# Patient Record
Sex: Male | Born: 2000 | Race: Black or African American | Hispanic: No | Marital: Single | Smoking: Never smoker
Health system: Southern US, Community
[De-identification: ages and names within clinical notes are randomized; demographics above are authoritative.]

## PROBLEM LIST (undated history)

## (undated) DIAGNOSIS — R278 Other lack of coordination: Secondary | ICD-10-CM

## (undated) DIAGNOSIS — F909 Attention-deficit hyperactivity disorder, unspecified type: Secondary | ICD-10-CM

## (undated) DIAGNOSIS — R488 Other symbolic dysfunctions: Secondary | ICD-10-CM

## (undated) HISTORY — DX: Attention-deficit hyperactivity disorder, unspecified type: F90.9

## (undated) HISTORY — DX: Other lack of coordination: R27.8

## (undated) HISTORY — DX: Other symbolic dysfunctions: R48.8

---
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
## 2021-01-03 ENCOUNTER — Emergency Department: Payer: 59

## 2021-01-03 ENCOUNTER — Other Ambulatory Visit: Payer: Self-pay

## 2021-01-03 DIAGNOSIS — F419 Anxiety disorder, unspecified: Secondary | ICD-10-CM | POA: Insufficient documentation

## 2021-01-03 DIAGNOSIS — Z79899 Other long term (current) drug therapy: Secondary | ICD-10-CM | POA: Insufficient documentation

## 2021-01-03 DIAGNOSIS — R079 Chest pain, unspecified: Secondary | ICD-10-CM | POA: Insufficient documentation

## 2021-01-03 LAB — URINE DRUG SCREEN, QUALITATIVE (ARMC ONLY)
Amphetamines, Ur Screen: NOT DETECTED
Barbiturates, Ur Screen: NOT DETECTED
Benzodiazepine, Ur Scrn: NOT DETECTED
Cannabinoid 50 Ng, Ur ~~LOC~~: NOT DETECTED
Cocaine Metabolite,Ur ~~LOC~~: NOT DETECTED
MDMA (Ecstasy)Ur Screen: NOT DETECTED
Methadone Scn, Ur: NOT DETECTED
Opiate, Ur Screen: NOT DETECTED
Phencyclidine (PCP) Ur S: NOT DETECTED
Tricyclic, Ur Screen: NOT DETECTED

## 2021-01-03 LAB — COMPREHENSIVE METABOLIC PANEL
ALT: 15 U/L (ref 0–44)
AST: 30 U/L (ref 15–41)
Albumin: 4.7 g/dL (ref 3.5–5.0)
Alkaline Phosphatase: 76 U/L (ref 38–126)
Anion gap: 9 (ref 5–15)
BUN: 7 mg/dL (ref 6–20)
CO2: 24 mmol/L (ref 22–32)
Calcium: 9.7 mg/dL (ref 8.9–10.3)
Chloride: 106 mmol/L (ref 98–111)
Creatinine, Ser: 0.83 mg/dL (ref 0.61–1.24)
GFR, Estimated: >60 mL/min (ref >60)
Glucose, Bld: 87 mg/dL (ref 70–99)
Potassium: 3.6 mmol/L (ref 3.5–5.1)
Sodium: 139 mmol/L (ref 135–145)
Total Bilirubin: 0.7 mg/dL (ref 0.3–1.2)
Total Protein: 7.5 g/dL (ref 6.5–8.1)

## 2021-01-03 LAB — TROPONIN I (HIGH SENSITIVITY)
Troponin I (High Sensitivity): <2 ng/L (ref <18)
Troponin I (High Sensitivity): <2 ng/L (ref <18)

## 2021-01-03 LAB — CBC
HCT: 42.5 % (ref 39.0–52.0)
Hemoglobin: 14.9 g/dL (ref 13.0–17.0)
MCH: 30.4 pg (ref 26.0–34.0)
MCHC: 35.1 g/dL (ref 30.0–36.0)
MCV: 86.7 fL (ref 80.0–100.0)
Platelets: 247 10*3/uL (ref 150–400)
RBC: 4.9 MIL/uL (ref 4.22–5.81)
RDW: 11.9 % (ref 11.5–15.5)
WBC: 5.1 10*3/uL (ref 4.0–10.5)
nRBC: 0 % (ref 0.0–0.2)

## 2021-01-03 LAB — ETHANOL: Alcohol, Ethyl (B): <10 mg/dL (ref <10)

## 2021-01-03 NOTE — ED Triage Notes (Signed)
Pt in with co chest pain x 30 min pt has hx of the same and was dx with panic attacks. Was prescribed anxiety meds and did take it today. Pt denies any SI or HI.

## 2021-01-03 NOTE — ED Notes (Signed)
Called for repeat labs from lobby without answer.

## 2021-01-04 ENCOUNTER — Emergency Department
Admission: EM | Admit: 2021-01-04 | Discharge: 2021-01-04 | Disposition: A | Discharge status: Home / Self Care | Payer: 59 | Attending: Emergency Medicine | Admitting: Emergency Medicine

## 2021-01-04 DIAGNOSIS — R079 Chest pain, unspecified: Secondary | ICD-10-CM

## 2021-01-04 DIAGNOSIS — F419 Anxiety disorder, unspecified: Secondary | ICD-10-CM

## 2021-01-04 NOTE — Discharge Instructions (Addendum)
Please make an appointment with [facility] to see a therapist.  Return to the ER for worsening symptoms, feelings of hurting yourself or others, or other concerns.

## 2021-01-04 NOTE — ED Provider Notes (Signed)
[facility] Emergency Department Provider Note   ____________________________________________   Event Date/Time   First MD Initiated Contact with Patient 01/04/21 [phone]     (approximate)  I have reviewed the triage vital signs and the nursing notes.   HISTORY  Chief Complaint Chest Pain and Anxiety    HPI Jason Cooke is a 20 y.o. male who presents to the ED from work with a chief complaint of chest pain and anxiety.  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  Went outside to get some fresh air and felt anxious.  He was recently noticed with panic attacks for similar symptoms; started on anxiolytic several days ago.  Neither he nor the mother can recall which medicine but he is to take it daily and he was only prescribed 15 pills.  Mother states these anxiety/panic attack episodes happened after he smoked synthetic marijuana several weeks ago.  Denies recent fever, cough, shortness of breath, abdominal pain, nausea, vomiting or dizziness.  Voices no complaints of chest pain at this time.  Denies recent travel, trauma or hormone use.  Denies SI/HI/AH/VH.     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  Prior to Admission medications   Medication Sig Start Date End Date Taking? Authorizing Provider  VYVANSE 60 MG capsule Take 1 capsule (60 mg total) by mouth every morning. 11/10/18   Wonda Cheng A, NP    Allergies Other  Family History  Problem Relation Age of Onset   Diabetes Mother    Cancer Maternal Aunt        breast   Dementia Maternal  Grandfather    Hypertension Paternal Uncle    Heart disease Paternal Uncle    Cancer Paternal Grandmother    Cancer Paternal Grandfather    Seizures Paternal Aunt    Anuerysm Paternal Aunt    Multiple sclerosis Paternal Aunt    Autism Cousin     Social History Social History   Tobacco Use   Smoking status: Never   Smokeless tobacco: Never  Substance Use Topics   Alcohol use: No    Alcohol/week: 0.0 standard drinks   Drug use: No    Review of Systems  Constitutional: No fever/chills Eyes: No visual changes. ENT: No sore throat. Cardiovascular: Positive for chest pain. Respiratory: Denies shortness of breath. Gastrointestinal: No abdominal pain.  No nausea, no vomiting.  No diarrhea.  No constipation. Genitourinary: Negative for dysuria. Musculoskeletal: Negative for back pain. Skin: Negative for rash. Neurological: Negative for headaches, focal weakness or numbness. Psychiatric: Positive for anxiety.  ____________________________________________   PHYSICAL EXAM:  VITAL SIGNS: ED Triage Vitals  Enc Vitals Group     BP 01/03/21 2015 (!) 148/88     Pulse Rate 01/03/21 2015 85     Resp 01/03/21 2015 20     Temp 01/03/21 2015 98.7 F (37.1 C)     Temp Source 01/03/21 2015 Oral     SpO2 01/03/21 2015 100 %     Weight 01/03/21 2013 138 lb (62.6 kg)     Height 01/03/21 2013 5\' 7"  (1.702 m)  Head Circumference --      Peak Flow --      Pain Score 01/03/21 2013 8     Pain Loc --      Pain Edu? --      Excl. in GC? --     Constitutional: Alert and oriented. Well appearing and in no acute distress. Eyes: Conjunctivae are normal. PERRL. EOMI. Head: Atraumatic. Nose: No congestion/rhinnorhea. Mouth/Throat: Mucous membranes are moist.   Neck: No stridor.  No thyromegaly. Cardiovascular: Normal rate, regular rhythm. Grossly normal heart sounds.  Good peripheral circulation. Respiratory: Normal respiratory effort.  No retractions. Lungs CTAB. Gastrointestinal:  Soft and nontender. No distention. No abdominal bruits. No CVA tenderness. Musculoskeletal: No lower extremity tenderness nor edema.  No joint effusions. Neurologic:  Normal speech and language. No gross focal neurologic deficits are appreciated. No gait instability. Skin:  Skin is warm, dry and intact. No rash noted. Psychiatric: Mood and affect are normal. Speech and behavior are normal.  ____________________________________________   LABS (all labs ordered are listed, but only abnormal results are displayed)  Labs Reviewed  CBC  COMPREHENSIVE METABOLIC PANEL  URINE DRUG SCREEN, QUALITATIVE ([facility] ONLY)  ETHANOL  TROPONIN I (HIGH SENSITIVITY)  TROPONIN I (HIGH SENSITIVITY)   ____________________________________________  EKG  ED ECG REPORT I, Timmya Blazier J, the attending physician, personally viewed and interpreted this ECG.   Date: 01/04/2021  EKG Time: 2043  Rate: 71  Rhythm: normal sinus rhythm  Axis: Normal  Intervals:none  ST&T Change: Nonspecific  ____________________________________________  RADIOLOGY I, Melyna Huron J, personally viewed and evaluated these images (plain radiographs) as part of my medical decision making, as well as reviewing the written report by the radiologist.  ED MD interpretation: No acute cardiopulmonary process  Official radiology report(s): DG Chest 2 View  Result Date: 01/03/2021 CLINICAL DATA:  Chest pain over the last 30 minutes EXAM: CHEST - 2 VIEW COMPARISON:  04/18/2018 FINDINGS: The heart size and mediastinal contours are within normal limits. Both lungs are clear. The visualized skeletal structures are unremarkable. IMPRESSION: No active cardiopulmonary disease is radiographically apparent. Electronically Signed   By: Gaylyn Rong M.D.   On: 01/03/2021 21:47    ____________________________________________   PROCEDURES  Procedure(s) performed (including Critical  Care):  Procedures   ____________________________________________   INITIAL IMPRESSION / ASSESSMENT AND PLAN / ED COURSE  As part of my medical decision making, I reviewed the following data within the electronic MEDICAL RECORD NUMBER History obtained from family, Nursing notes reviewed and incorporated, Labs reviewed, EKG interpreted, Old chart reviewed, and Notes from prior ED visits     20 year old male presenting with chest pain, anxiety. Differential diagnosis includes, but is not limited to, ACS, aortic dissection, pulmonary embolism, cardiac tamponade, pneumothorax, pneumonia, pericarditis, myocarditis, GI-related causes including esophagitis/gastritis, and musculoskeletal chest wall pain.     Cardiac work-up unremarkable.  I am unable to find what anxiolytic urgent care place patient on.  Would not want to prescribe something that might double dose or overdose him.  Encourage patient to follow-up with [facility].  Strict return precautions given.  Patient verbalizes understanding agrees with plan of care.       ____________________________________________   FINAL CLINICAL IMPRESSION(S) / ED DIAGNOSES  Final diagnoses:  Anxiety  Chest pain, unspecified type     ED Discharge Orders     None        Note:  This document was prepared using Dragon voice recognition software and may include unintentional dictation errors.  Irean Hong, MD 01/04/21 [phone]

## 2023-09-01 ENCOUNTER — Other Ambulatory Visit: Payer: Self-pay

## 2023-09-01 ENCOUNTER — Emergency Department
Admission: EM | Admit: 2023-09-01 | Discharge: 2023-09-02 | Disposition: A | Discharge status: Home / Self Care | Attending: Emergency Medicine | Admitting: Emergency Medicine

## 2023-09-01 ENCOUNTER — Emergency Department

## 2023-09-01 DIAGNOSIS — S2020XA Contusion of thorax, unspecified, initial encounter: Secondary | ICD-10-CM | POA: Insufficient documentation

## 2023-09-01 DIAGNOSIS — S20229A Contusion of unspecified back wall of thorax, initial encounter: Secondary | ICD-10-CM

## 2023-09-01 DIAGNOSIS — S301XXA Contusion of abdominal wall, initial encounter: Secondary | ICD-10-CM | POA: Insufficient documentation

## 2023-09-01 DIAGNOSIS — Y92410 Unspecified street and highway as the place of occurrence of the external cause: Secondary | ICD-10-CM | POA: Diagnosis not present

## 2023-09-01 DIAGNOSIS — T796XXA Traumatic ischemia of muscle, initial encounter: Secondary | ICD-10-CM | POA: Insufficient documentation

## 2023-09-01 DIAGNOSIS — T1490XA Injury, unspecified, initial encounter: Secondary | ICD-10-CM

## 2023-09-01 MED ORDER — SODIUM CHLORIDE 0.9 % IV BOLUS
500.0000 mL | Freq: Once | INTRAVENOUS | Status: AC
  Administered 2023-09-02: 500 mL via INTRAVENOUS

## 2023-09-01 MED ORDER — ONDANSETRON HCL 4 MG/2ML IJ SOLN
4.0000 mg | Freq: Once | INTRAMUSCULAR | Status: AC
  Administered 2023-09-01: 4 mg via INTRAVENOUS
  Filled 2023-09-01: qty 2

## 2023-09-01 MED ORDER — FENTANYL CITRATE PF 50 MCG/ML IJ SOSY
50.0000 ug | PREFILLED_SYRINGE | Freq: Once | INTRAMUSCULAR | Status: AC
  Administered 2023-09-01: 50 ug via INTRAVENOUS
  Filled 2023-09-01: qty 1
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
## 2023-09-01 NOTE — ED Notes (Signed)
 Sent blue, red, light green, and lavender tubes to lab
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
## 2023-09-02 ENCOUNTER — Emergency Department

## 2023-09-02 LAB — COMPREHENSIVE METABOLIC PANEL WITH GFR
ALT: 66 U/L — ABNORMAL HIGH (ref 0–44)
ALT: 82 U/L — ABNORMAL HIGH (ref 0–44)
AST: 55 U/L — ABNORMAL HIGH (ref 15–41)
AST: 70 U/L — ABNORMAL HIGH (ref 15–41)
Albumin: 3.5 g/dL (ref 3.5–5.0)
Albumin: 4.5 g/dL (ref 3.5–5.0)
Alkaline Phosphatase: 52 U/L (ref 38–126)
Alkaline Phosphatase: 61 U/L (ref 38–126)
Anion gap: 12 (ref 5–15)
Anion gap: 5 (ref 5–15)
BUN: 11 mg/dL (ref 6–20)
BUN: 11 mg/dL (ref 6–20)
CO2: 19 mmol/L — ABNORMAL LOW (ref 22–32)
CO2: 24 mmol/L (ref 22–32)
Calcium: 8.3 mg/dL — ABNORMAL LOW (ref 8.9–10.3)
Calcium: 9.4 mg/dL (ref 8.9–10.3)
Chloride: 107 mmol/L (ref 98–111)
Chloride: 109 mmol/L (ref 98–111)
Creatinine, Ser: 0.82 mg/dL (ref 0.61–1.24)
Creatinine, Ser: 0.92 mg/dL (ref 0.61–1.24)
GFR, Estimated: >60 mL/min (ref >60)
GFR, Estimated: >60 mL/min (ref >60)
Glucose, Bld: 104 mg/dL — ABNORMAL HIGH (ref 70–99)
Glucose, Bld: 89 mg/dL (ref 70–99)
Potassium: 3.4 mmol/L — ABNORMAL LOW (ref 3.5–5.1)
Potassium: 3.7 mmol/L (ref 3.5–5.1)
Sodium: 138 mmol/L (ref 135–145)
Sodium: 138 mmol/L (ref 135–145)
Total Bilirubin: 0.9 mg/dL (ref 0.0–1.2)
Total Bilirubin: 0.9 mg/dL (ref 0.0–1.2)
Total Protein: 6 g/dL — ABNORMAL LOW (ref 6.5–8.1)
Total Protein: 7.4 g/dL (ref 6.5–8.1)

## 2023-09-02 LAB — CBC
HCT: 36.4 % — ABNORMAL LOW (ref 39.0–52.0)
HCT: 41.2 % (ref 39.0–52.0)
Hemoglobin: 12.4 g/dL — ABNORMAL LOW (ref 13.0–17.0)
Hemoglobin: 14.1 g/dL (ref 13.0–17.0)
MCH: 29.7 pg (ref 26.0–34.0)
MCH: 30 pg (ref 26.0–34.0)
MCHC: 34.1 g/dL (ref 30.0–36.0)
MCHC: 34.2 g/dL (ref 30.0–36.0)
MCV: 86.9 fL (ref 80.0–100.0)
MCV: 87.9 fL (ref 80.0–100.0)
Platelets: 253 10*3/uL (ref 150–400)
Platelets: 299 10*3/uL (ref 150–400)
RBC: 4.14 MIL/uL — ABNORMAL LOW (ref 4.22–5.81)
RBC: 4.74 MIL/uL (ref 4.22–5.81)
RDW: 11.9 % (ref 11.5–15.5)
RDW: 12.1 % (ref 11.5–15.5)
WBC: 7.5 10*3/uL (ref 4.0–10.5)
WBC: 8.8 10*3/uL (ref 4.0–10.5)
nRBC: 0 % (ref 0.0–0.2)
nRBC: 0 % (ref 0.0–0.2)

## 2023-09-02 LAB — SAMPLE TO BLOOD BANK

## 2023-09-02 LAB — PROTIME-INR
INR: 1.1 (ref 0.8–1.2)
Prothrombin Time: 14.5 s (ref 11.4–15.2)

## 2023-09-02 LAB — URINALYSIS, ROUTINE W REFLEX MICROSCOPIC
Bilirubin Urine: NEGATIVE
Glucose, UA: NEGATIVE mg/dL
Hgb urine dipstick: NEGATIVE
Ketones, ur: NEGATIVE mg/dL
Nitrite: NEGATIVE
Protein, ur: NEGATIVE mg/dL
Specific Gravity, Urine: >1.046 — ABNORMAL HIGH (ref 1.005–1.030)
pH: 6 (ref 5.0–8.0)

## 2023-09-02 LAB — LACTIC ACID, PLASMA: Lactic Acid, Venous: 0.9 mmol/L (ref 0.5–1.9)

## 2023-09-02 LAB — ETHANOL: Alcohol, Ethyl (B): <15 mg/dL (ref <15)

## 2023-09-02 LAB — CK
Total CK: 1594 U/L — ABNORMAL HIGH (ref 49–397)
Total CK: 1902 U/L — ABNORMAL HIGH (ref 49–397)

## 2023-09-02 MED ORDER — SODIUM CHLORIDE 0.9 % IV BOLUS
1500.0000 mL | Freq: Once | INTRAVENOUS | Status: AC
  Administered 2023-09-02: 1500 mL via INTRAVENOUS

## 2023-09-02 MED ORDER — IOHEXOL 300 MG/ML  SOLN
100.0000 mL | Freq: Once | INTRAMUSCULAR | Status: AC | PRN
  Administered 2023-09-02: 100 mL via INTRAVENOUS

## 2023-09-02 NOTE — ED Notes (Signed)
 CCMD called to initiate cardiac monitoring
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
